# Patient Record
Sex: Female | Born: 1990 | Race: Black or African American | Hispanic: No | Marital: Single | Smoking: Never smoker
Health system: Southern US, Community
[De-identification: ages and names within clinical notes are randomized; demographics above are authoritative.]

---

## 2014-07-22 ENCOUNTER — Telehealth: Payer: Self-pay | Admitting: Hematology

## 2014-07-22 NOTE — Telephone Encounter (Signed)
S/W PATIENT AND GAVE NP APPT FOR 02/04 @ 10:30 W/DR. FENG REFERRING LAKASHA Ardath SaxGODWIN CARTER, FNP DX-BETA THALASSEMIA

## 2014-08-11 ENCOUNTER — Ambulatory Visit (HOSPITAL_BASED_OUTPATIENT_CLINIC_OR_DEPARTMENT_OTHER): Payer: BLUE CROSS/BLUE SHIELD

## 2014-08-11 ENCOUNTER — Ambulatory Visit: Payer: BLUE CROSS/BLUE SHIELD

## 2014-08-11 ENCOUNTER — Telehealth: Payer: Self-pay | Admitting: Hematology

## 2014-08-11 ENCOUNTER — Encounter: Payer: Self-pay | Admitting: Hematology

## 2014-08-11 ENCOUNTER — Encounter (INDEPENDENT_AMBULATORY_CARE_PROVIDER_SITE_OTHER): Payer: Self-pay

## 2014-08-11 ENCOUNTER — Ambulatory Visit (HOSPITAL_BASED_OUTPATIENT_CLINIC_OR_DEPARTMENT_OTHER): Payer: BLUE CROSS/BLUE SHIELD | Admitting: Hematology

## 2014-08-11 VITALS — BP 124/66 | HR 71 | Temp 98.5°F | Resp 22 | Ht 63.0 in | Wt 161.6 lb

## 2014-08-11 DIAGNOSIS — D649 Anemia, unspecified: Secondary | ICD-10-CM

## 2014-08-11 DIAGNOSIS — D6489 Other specified anemias: Secondary | ICD-10-CM

## 2014-08-11 LAB — COMPREHENSIVE METABOLIC PANEL (CC13)
ALBUMIN: 3.9 g/dL (ref 3.5–5.0)
ALK PHOS: 54 U/L (ref 40–150)
ALT: 12 U/L (ref 0–55)
AST: 16 U/L (ref 5–34)
Anion Gap: 8 mEq/L (ref 3–11)
BUN: 12.6 mg/dL (ref 7.0–26.0)
CHLORIDE: 111 meq/L — AB (ref 98–109)
CO2: 20 mEq/L — ABNORMAL LOW (ref 22–29)
Calcium: 8.9 mg/dL (ref 8.4–10.4)
Creatinine: 0.7 mg/dL (ref 0.6–1.1)
EGFR: >90 mL/min/{1.73_m2} (ref >90)
GLUCOSE: 88 mg/dL (ref 70–140)
Potassium: 3.9 mEq/L (ref 3.5–5.1)
SODIUM: 139 meq/L (ref 136–145)
TOTAL PROTEIN: 7.1 g/dL (ref 6.4–8.3)
Total Bilirubin: 0.45 mg/dL (ref 0.20–1.20)

## 2014-08-11 LAB — CBC & DIFF AND RETIC
BASO%: 0.3 % (ref 0.0–2.0)
Basophils Absolute: 0 10*3/uL (ref 0.0–0.1)
EOS ABS: 0.1 10*3/uL (ref 0.0–0.5)
EOS%: 0.9 % (ref 0.0–7.0)
HCT: 34 % — ABNORMAL LOW (ref 34.8–46.6)
HEMOGLOBIN: 11 g/dL — AB (ref 11.6–15.9)
Immature Retic Fract: 7.7 % (ref 1.60–10.00)
LYMPH%: 30.5 % (ref 14.0–49.7)
MCH: 22.3 pg — AB (ref 25.1–34.0)
MCHC: 32.4 g/dL (ref 31.5–36.0)
MCV: 69 fL — ABNORMAL LOW (ref 79.5–101.0)
MONO#: 0.4 10*3/uL (ref 0.1–0.9)
MONO%: 7.4 % (ref 0.0–14.0)
NEUT#: 3.5 10*3/uL (ref 1.5–6.5)
NEUT%: 60.9 % (ref 38.4–76.8)
NRBC: 0 % (ref 0–0)
Platelets: 247 10*3/uL (ref 145–400)
RBC: 4.93 10*6/uL (ref 3.70–5.45)
RDW: 16.1 % — ABNORMAL HIGH (ref 11.2–14.5)
RETIC %: 1.7 % (ref 0.70–2.10)
Retic Ct Abs: 83.81 10*3/uL (ref 33.70–90.70)
WBC: 5.8 10*3/uL (ref 3.9–10.3)
lymph#: 1.8 10*3/uL (ref 0.9–3.3)

## 2014-08-11 LAB — IRON AND TIBC CHCC
%SAT: 30 % (ref 21–57)
Iron: 88 ug/dL (ref 41–142)
TIBC: 291 ug/dL (ref 236–444)
UIBC: 204 ug/dL (ref 120–384)

## 2014-08-11 LAB — LACTATE DEHYDROGENASE (CC13): LDH: 124 U/L — ABNORMAL LOW (ref 125–245)

## 2014-08-11 LAB — MORPHOLOGY: PLT EST: ADEQUATE

## 2014-08-11 LAB — FERRITIN CHCC: Ferritin: 17 ng/ml (ref 9–269)

## 2014-08-11 NOTE — Progress Notes (Signed)
Checked in new pt with no financial concerns prior to seeing the dr.  Pt is here for a hematology concern so financial assistance may not be needed but she has Raquel's card for any billing questions or concerns. °

## 2014-08-11 NOTE — Progress Notes (Signed)
[facility]  Telephone:([phone] Fax:([phone]  Clinic New consult Note   Patient Care Team: No Pcp Per Patient as PCP - General (General Practice) 08/11/2014  CHIEF COMPLAINTS/PURPOSE OF CONSULTATION:  Anemia   HISTORY OF PRESENTING ILLNESS:  Teresa BattyBrianda Barnick 24 y.o. female is here because of anemia. She was referred by her GYN.  She was told to have iron deficient anemia when she was a teenager, but she does not recall her blood counts number. She did take some oral iron for short period time, but did not continue. She did not require any blood transfusion.  Her mentral period is heavy, it lasts 5-6 days, with heavy bleeding for 4 days, she changes pads every 5-6 hours.  She denied any bleeding episodes including hematochezia, melana, hemoptysis, hematuria or epitaxis. No mucosal bleeding or easy bruising.   She has been following with her GYN as her primary care physician. She was told her iron level was low, she may have thalassemia. None of her prior lab results are available during her office visit. She denies any family history of anemia.  She has mild fatigue, but able to do daily activities, able to dance fo 30 mins, but does need take a few breaks. She has palpitation when she runs all climbs stairs. She is a Archivistcollege student, in her IT sales professionalmaster degree program.   MEDICAL HISTORY:  None   SURGICAL HISTORY: Tonsilectomy   SOCIAL HISTORY: History   Social History  . Marital Status: Single    Spouse Name: N/A    Number of Children: N/A  . Years of Education: N/A   Occupational History  . Not on file.   Social History Main Topics  . Smoking status: Not on file  . Smokeless tobacco: Not on file  . Alcohol Use: Not on file  . Drug Use: Not on file  . Sexual Activity: Not on file   Other Topics Concern  . Not on file   Social History Narrative  . No narrative on file    FAMILY HISTORY: No family history on file.  ALLERGIES:  has No Known  Allergies.  MEDICATIONS:  Current Outpatient Prescriptions  Medication Sig Dispense Refill  . NUVARING 0.12-0.015 MG/24HR vaginal ring   1   No current facility-administered medications for this visit.    REVIEW OF SYSTEMS:   Constitutional: Denies fevers, chills or abnormal night sweats Eyes: Denies blurriness of vision, double vision or watery eyes Ears, nose, mouth, throat, and face: Denies mucositis or sore throat Respiratory: Denies cough, dyspnea or wheezes Cardiovascular: Denies palpitation, chest discomfort or lower extremity swelling Gastrointestinal:  Denies nausea, heartburn or change in bowel habits Skin: Denies abnormal skin rashes Lymphatics: Denies new lymphadenopathy or easy bruising Neurological:Denies numbness, tingling or new weaknesses Behavioral/Psych: Mood is stable, no new changes  All other systems were reviewed with the patient and are negative.  PHYSICAL EXAMINATION: ECOG PERFORMANCE STATUS: 0 - Asymptomatic  Filed Vitals:   08/11/14 1124  BP: 124/66  Pulse: 71  Temp: 98.5 F (36.9 C)  Resp: 22   Filed Weights   08/11/14 1124  Weight: 161 lb 9.6 oz (73.301 kg)    GENERAL:alert, no distress and comfortable SKIN: skin color, texture, turgor are normal, no rashes or significant lesions EYES: normal, conjunctiva are pink and non-injected, sclera clear OROPHARYNX:no exudate, no erythema and lips, buccal mucosa, and tongue normal  NECK: supple, thyroid normal size, non-tender, without nodularity LYMPH:  no palpable lymphadenopathy in the cervical, axillary  or inguinal LUNGS: clear to auscultation and percussion with normal breathing effort HEART: regular rate & rhythm and no murmurs and no lower extremity edema ABDOMEN:abdomen soft, non-tender and normal bowel sounds Musculoskeletal:no cyanosis of digits and no clubbing  PSYCH: alert & oriented x 3 with fluent speech NEURO: no focal motor/sensory deficits  LABORATORY DATA:  I have reviewed the  data as listed CBC Latest Ref Rng 08/11/2014  WBC 3.9 - 10.3 10e3/uL 5.8  Hemoglobin 11.6 - 15.9 g/dL 11.0(L)  Hematocrit 34.8 - 46.6 % 34.0(L)  Platelets 145 - 400 10e3/uL 247    CMP Latest Ref Rng 08/11/2014  Glucose 70 - 140 mg/dl 88  BUN 7.0 - 60.4 mg/dL 54.0  Creatinine 0.6 - 1.1 mg/dL 0.7  Sodium 981 - 191 mEq/L 139  Potassium 3.5 - 5.1 mEq/L 3.9  CO2 22 - 29 mEq/L 20(L)  Calcium 8.4 - 10.4 mg/dL 8.9  Total Protein 6.4 - 8.3 g/dL 7.1  Total Bilirubin 4.78 - 1.20 mg/dL 2.95  Alkaline Phos 40 - 150 U/L 54  AST 5 - 34 U/L 16  ALT 0 - 55 U/L 12   Ferritin  Status: Finalresult Visible to patient:  Not Released Nextappt: 09/12/2014 at 09:30 AM in Oncology Mosetta Putt, Terrace Arabia, MD) Dx:  Anemia, unspecified anemia type         Ref Range 12:17 PM    Ferritin 9 - 269 ng/ml 17      Iron and TIBC [facility]  Status: Finalresult Visible to patient:  Not Released Nextappt: 09/12/2014 at 09:30 AM in Oncology Mosetta Putt, Terrace Arabia, MD) Dx:  Anemia, unspecified anemia type         Ref Range 12:17 PM    Iron 41 - 142 ug/dL 88   TIBC 621 - 308 ug/dL 657   UIBC 846 - 962 ug/dL 952   %SAT 21 - 57 % 30        RADIOGRAPHIC STUDIES: I have personally reviewed the radiological images as listed and agreed with the findings in the report. No results found.  ASSESSMENT & PLAN:  24 year old African-American female, possible anemia since teenager, here for anemia.  1. Microcytic, hypo-productive anemia -Her CBC today showed hemoglobin 11.0, hematocrit 34%, with MCV 69, MCH 22.3, normal MCHC and slightly increased RDW. Her absolute reticular count is 88. Her white count and platelet count are normal. -The study showed normal ferritin, serum iron, TIBC and saturation. This is not consistent with iron deficient anemia. -I'll check her TSH level, hemoglobin electrophoresis, alpha and beta thalassemia gene mutation.  Follow-up: I'll see her back in 1 month's to discuss the  above laboratory results.  All questions were answered. The patient knows to call the clinic with any problems, questions or concerns. I spent 30 minutes counseling the patient face to face. The total time spent in the appointment was 40 minutes and more than 50% was on counseling.     Malachy Mood, MD 08/11/2014  6:19 PM

## 2014-08-11 NOTE — Telephone Encounter (Signed)
Pt confirmed labs/ov per 02/04 POF, gave pt AVS.... KJ °

## 2014-08-15 LAB — HEMOGLOBINOPATHY EVALUATION
HGB S QUANTITAION: 0 %
Hemoglobin Other: 0 %
Hgb A2 Quant: 5 % — ABNORMAL HIGH (ref 2.2–3.2)
Hgb A: 91 % — ABNORMAL LOW (ref 96.8–97.8)
Hgb F Quant: 4 % — ABNORMAL HIGH (ref 0.0–2.0)

## 2014-08-15 LAB — FOLATE RBC: RBC Folate: 650 ng/mL (ref >280)

## 2014-08-15 LAB — HAPTOGLOBIN: Haptoglobin: 113 mg/dL (ref 43–212)

## 2014-08-15 LAB — VITAMIN B12: Vitamin B-12: 666 pg/mL (ref 211–911)

## 2014-08-30 ENCOUNTER — Emergency Department (HOSPITAL_COMMUNITY): Payer: BLUE CROSS/BLUE SHIELD

## 2014-08-30 ENCOUNTER — Encounter (HOSPITAL_COMMUNITY): Payer: Self-pay | Admitting: Physical Medicine and Rehabilitation

## 2014-08-30 ENCOUNTER — Emergency Department (HOSPITAL_COMMUNITY)
Admission: EM | Admit: 2014-08-30 | Discharge: 2014-08-30 | Disposition: A | Discharge status: Home / Self Care | Payer: BLUE CROSS/BLUE SHIELD | Attending: Emergency Medicine | Admitting: Emergency Medicine

## 2014-08-30 DIAGNOSIS — N831 Corpus luteum cyst of ovary, unspecified side: Secondary | ICD-10-CM

## 2014-08-30 DIAGNOSIS — K5900 Constipation, unspecified: Secondary | ICD-10-CM | POA: Insufficient documentation

## 2014-08-30 DIAGNOSIS — Z3202 Encounter for pregnancy test, result negative: Secondary | ICD-10-CM | POA: Diagnosis not present

## 2014-08-30 DIAGNOSIS — R109 Unspecified abdominal pain: Secondary | ICD-10-CM

## 2014-08-30 DIAGNOSIS — R1031 Right lower quadrant pain: Secondary | ICD-10-CM | POA: Diagnosis present

## 2014-08-30 DIAGNOSIS — R1030 Lower abdominal pain, unspecified: Secondary | ICD-10-CM

## 2014-08-30 LAB — CBC WITH DIFFERENTIAL/PLATELET
BASOS ABS: 0 10*3/uL (ref 0.0–0.1)
Basophils Relative: 0 % (ref 0–1)
EOS ABS: 0.1 10*3/uL (ref 0.0–0.7)
EOS PCT: 1 % (ref 0–5)
HCT: 31.8 % — ABNORMAL LOW (ref 36.0–46.0)
Hemoglobin: 10.2 g/dL — ABNORMAL LOW (ref 12.0–15.0)
Lymphocytes Relative: 19 % (ref 12–46)
Lymphs Abs: 1.5 10*3/uL (ref 0.7–4.0)
MCH: 22 pg — AB (ref 26.0–34.0)
MCHC: 32.1 g/dL (ref 30.0–36.0)
MCV: 68.7 fL — ABNORMAL LOW (ref 78.0–100.0)
Monocytes Absolute: 0.8 10*3/uL (ref 0.1–1.0)
Monocytes Relative: 10 % (ref 3–12)
Neutro Abs: 5.7 10*3/uL (ref 1.7–7.7)
Neutrophils Relative %: 71 % (ref 43–77)
Platelets: 240 10*3/uL (ref 150–400)
RBC: 4.63 MIL/uL (ref 3.87–5.11)
RDW: 15.8 % — ABNORMAL HIGH (ref 11.5–15.5)
WBC: 8.1 10*3/uL (ref 4.0–10.5)

## 2014-08-30 LAB — URINALYSIS, ROUTINE W REFLEX MICROSCOPIC
BILIRUBIN URINE: NEGATIVE
Glucose, UA: NEGATIVE mg/dL
KETONES UR: 40 mg/dL — AB
Leukocytes, UA: NEGATIVE
Nitrite: NEGATIVE
PH: 6 (ref 5.0–8.0)
Protein, ur: NEGATIVE mg/dL
SPECIFIC GRAVITY, URINE: 1.025 (ref 1.005–1.030)
Urobilinogen, UA: 0.2 mg/dL (ref 0.0–1.0)

## 2014-08-30 LAB — WET PREP, GENITAL
CLUE CELLS WET PREP: NONE SEEN
Trich, Wet Prep: NONE SEEN
Yeast Wet Prep HPF POC: NONE SEEN

## 2014-08-30 LAB — COMPREHENSIVE METABOLIC PANEL
ALK PHOS: 52 U/L (ref 39–117)
ALT: 11 U/L (ref 0–35)
AST: 16 U/L (ref 0–37)
Albumin: 3.6 g/dL (ref 3.5–5.2)
Anion gap: 5 (ref 5–15)
BILIRUBIN TOTAL: 0.8 mg/dL (ref 0.3–1.2)
BUN: 7 mg/dL (ref 6–23)
CO2: 27 mmol/L (ref 19–32)
Calcium: 8.9 mg/dL (ref 8.4–10.5)
Chloride: 104 mmol/L (ref 96–112)
Creatinine, Ser: 0.63 mg/dL (ref 0.50–1.10)
GFR calc Af Amer: >90 mL/min (ref >90)
GLUCOSE: 92 mg/dL (ref 70–99)
POTASSIUM: 3.9 mmol/L (ref 3.5–5.1)
Sodium: 136 mmol/L (ref 135–145)
Total Protein: 7.1 g/dL (ref 6.0–8.3)

## 2014-08-30 LAB — URINE MICROSCOPIC-ADD ON

## 2014-08-30 LAB — LIPASE, BLOOD: LIPASE: 20 U/L (ref 11–59)

## 2014-08-30 LAB — POC URINE PREG, ED: PREG TEST UR: NEGATIVE

## 2014-08-30 MED ORDER — HYDROCODONE-ACETAMINOPHEN 5-325 MG PO TABS: 1.0000 | ORAL_TABLET | Freq: Four times a day (QID) | ORAL | Status: DC | PRN

## 2014-08-30 NOTE — ED Provider Notes (Signed)
CSN: [id number]     Arrival date & time 08/30/14  1144 History   First MD Initiated Contact with Patient 08/30/14 1404     Chief Complaint  Patient presents with  . Abdominal Pain     (Consider location/radiation/quality/duration/timing/severity/associated sxs/prior Treatment) Patient is a 24 y.o. female presenting with abdominal pain. The history is provided by the patient. No language interpreter was used.  Abdominal Pain Pain location:  Suprapubic, RLQ and LLQ Pain quality: cramping and sharp   Pain radiates to:  LLQ and suprapubic region Pain severity:  Moderate Onset quality:  Gradual Duration:  5 days Timing:  Intermittent Progression:  Unchanged Chronicity:  New Context: not awakening from sleep, not eating, not previous surgeries, not recent illness, not recent sexual activity, not retching, not sick contacts, not suspicious food intake and not trauma   Worsened by:  Nothing tried Ineffective treatments:  OTC medications (Tramadol, Percocet) Associated symptoms: constipation, nausea and vomiting   Associated symptoms: no chest pain, no chills, no diarrhea, no dysuria, no fever, no vaginal bleeding and no vaginal discharge   Nausea:    Severity:  Mild   Onset quality:  Sudden   Duration:  1 day   Timing:  Sporadic   Progression:  Improving Vomiting:    Quality:  Stomach contents   Number of occurrences:  1   Severity:  Mild   Duration:  1 day   Timing:  Sporadic   Progression:  Improving   History reviewed. No pertinent past medical history. History reviewed. No pertinent past surgical history. No family history on file. History  Substance Use Topics  . Smoking status: Never Smoker   . Smokeless tobacco: Not on file  . Alcohol Use: Yes     Comment: social drinker    OB History    No data available     Review of Systems  Constitutional: Negative for fever and chills.  Cardiovascular: Negative for chest pain.  Gastrointestinal: Positive for nausea,  vomiting, abdominal pain and constipation. Negative for diarrhea.  Genitourinary: Negative for dysuria, flank pain, vaginal bleeding, vaginal discharge and pelvic pain.  Musculoskeletal: Negative for myalgias.  Skin: Negative for rash.  Neurological: Negative for dizziness, weakness, light-headedness, numbness and headaches.  Hematological: Negative for adenopathy. Does not bruise/bleed easily.  All other systems reviewed and are negative.     Allergies  Review of patient's allergies indicates no known allergies.  Home Medications   Prior to Admission medications   Medication Sig Start Date End Date Taking? Authorizing Provider  ibuprofen (ADVIL,MOTRIN) 600 MG tablet Take 600 mg by mouth every 6 (six) hours as needed for mild pain.   Yes Historical Provider, MD  NUVARING 0.12-0.015 MG/24HR vaginal ring Place 1 each vaginally every 28 (twenty-eight) days.  07/28/14  Yes Historical Provider, MD  Oxycodone-Acetaminophen (PERCOCET PO) Take 2 tablets by mouth every 4 (four) hours.   Yes Historical Provider, MD  traMADol (ULTRAM) 50 MG tablet Take 50 mg by mouth every 6 (six) hours as needed for moderate pain.   Yes Historical Provider, MD   BP 119/69 mmHg  Pulse 93  Temp(Src) 98.3 F (36.8 C) (Oral)  Resp 18  SpO2 99% Physical Exam  Constitutional: She is oriented to person, place, and time. She appears well-developed and well-nourished.  HENT:  Head: Normocephalic and atraumatic.  Right Ear: External ear normal.  Left Ear: External ear normal.  Mouth/Throat: Oropharynx is clear and moist.  Eyes: Conjunctivae and EOM are normal. Pupils are equal,  round, and reactive to light.  Neck: Normal range of motion. Neck supple.  Cardiovascular: Normal rate, regular rhythm, normal heart sounds and intact distal pulses.   Pulmonary/Chest: Effort normal and breath sounds normal. No respiratory distress. She has no wheezes. She has no rales. She exhibits no tenderness.  Abdominal: Bowel sounds  are normal. She exhibits no mass. There is tenderness. There is no rebound and no guarding.  Some asymmetic lower abdominal fullness.  TTP RLQ, suprapubic, LLQ, pelvis.  Otherwise soft, no guarding, no rebound.   Musculoskeletal: Normal range of motion.  Neurological: She is alert and oriented to person, place, and time.  Skin: Skin is warm and dry.  Nursing note and vitals reviewed.   ED Course  Procedures (including critical care time) Labs Review Labs Reviewed  WET PREP, GENITAL - Abnormal; Notable for the following:    WBC, Wet Prep HPF POC MANY (*)    All other components within normal limits  CBC WITH DIFFERENTIAL/PLATELET - Abnormal; Notable for the following:    Hemoglobin 10.2 (*)    HCT 31.8 (*)    MCV 68.7 (*)    MCH 22.0 (*)    RDW 15.8 (*)    All other components within normal limits  URINALYSIS, ROUTINE W REFLEX MICROSCOPIC - Abnormal; Notable for the following:    APPearance HAZY (*)    Hgb urine dipstick SMALL (*)    Ketones, ur 40 (*)    All other components within normal limits  URINE MICROSCOPIC-ADD ON - Abnormal; Notable for the following:    Squamous Epithelial / LPF FEW (*)    All other components within normal limits  COMPREHENSIVE METABOLIC PANEL  LIPASE, BLOOD  POC URINE PREG, ED  GC/CHLAMYDIA PROBE AMP ([place])    Imaging Review No results found.   EKG Interpretation None      MDM   Final diagnoses:  Lower abdominal pain  Corpus luteum cyst   24 yo F hx of anemia, presents with CC abdominal/pelvic pain X 5 days.  Associated with recent LMP, pt on Nuvaring for birth control.  Describes pain as crampy, then sharp, worsening Sunday, RLQ with radiation suprapubic, LLQ, and into pelvis.  Pt had episode of nausea/vomiting X 1 this morning, and c/o of some constipation.  No prior abdominal surgeries.    Physical exam as above.  Afebrile, VS WNL.  CBC with microcytic anemia to 10.2 (down from 11.0 two weeks ago).  CMP WNL.    Pelvic exam  performed by Dr. Hyacinth MeekerMiller, reportedly unremarkable, see his note for details.  Pelvic US demonstrated R ovarian corpus luteal cyst.  Likely cause of pt's pain.  On reeval pt still feels well, declines pain meds.  Abdomen reexam remains soft, non concerning for acute abdomen.  Wet prep neg for trich, clue cells, or yeast.  WBC's present, pt just finishing LMP.    Diagnosis of R ovarian cyst.    D/c home in good condition.  Will have pt f/u with gynecology in 1 week, [facility]  Norco Rx for pain control.  Return precautions given.  Pt understands and agrees with plan.   Jon GillsWebb, Breniyah Romm  Discussed pt with Dr. Shirley FriarMiller   Leticia Mcdiarmid, MD 08/30/14 Serena Croissant1928  Vida RollerBrian D Miller, MD 08/30/14 2116

## 2014-08-30 NOTE — ED Provider Notes (Signed)
The patient is a 24 year old female, she presents with approximately one month of progressive fluctuating lower abdominal and pelvic pain both right side left side and suprapubic. On exam the patient is very soft, no peritoneal signs, no guarding, mild tenderness in the lower right and left pelvis, pelvic exam as chaperoned by nurse Clydie BraunKaren, no signs of significant vaginal discharge, small amount of brown discharge from the cervix, no foul smell, NuvaRing present, no tenderness over the cervix, no cervical motion tenderness, no adnexal tenderness or masses. Normal-appearing external genitalia. Labs unremarkable, patient will receive ultrasound of the pelvis to evaluate for other sources of pain. GC, chlamydia, wet prep sent.  I saw and evaluated the patient, reviewed the resident's note and I agree with the findings and plan.  I personally interpreted the EKG as well as the resident and agree with the interpretation on the resident's chart.  Final diagnoses:  Lower abdominal pain  Corpus luteum cyst      Vida RollerBrian D Laia Wiley, MD 08/30/14 2116

## 2014-08-30 NOTE — ED Notes (Signed)
Pt presents to department for evaluation of RLQ abdominal pain and nausea/vomiting. Onset Sunday night. Denies vaginal symptoms. 8/10 pain upon arrival to ED.

## 2014-08-31 LAB — GC/CHLAMYDIA PROBE AMP (~~LOC~~) NOT AT ARMC
Chlamydia: NEGATIVE
Neisseria Gonorrhea: NEGATIVE

## 2014-09-12 ENCOUNTER — Ambulatory Visit: Payer: BLUE CROSS/BLUE SHIELD | Admitting: Hematology

## 2014-09-26 ENCOUNTER — Ambulatory Visit (HOSPITAL_BASED_OUTPATIENT_CLINIC_OR_DEPARTMENT_OTHER): Payer: BLUE CROSS/BLUE SHIELD

## 2014-09-26 ENCOUNTER — Telehealth: Payer: Self-pay | Admitting: Hematology

## 2014-09-26 ENCOUNTER — Encounter: Payer: Self-pay | Admitting: Hematology

## 2014-09-26 ENCOUNTER — Ambulatory Visit (HOSPITAL_BASED_OUTPATIENT_CLINIC_OR_DEPARTMENT_OTHER): Payer: BLUE CROSS/BLUE SHIELD | Admitting: Hematology

## 2014-09-26 VITALS — BP 110/66 | HR 76 | Temp 98.4°F | Resp 19 | Ht 63.0 in | Wt 164.5 lb

## 2014-09-26 DIAGNOSIS — D563 Thalassemia minor: Secondary | ICD-10-CM

## 2014-09-26 DIAGNOSIS — D649 Anemia, unspecified: Secondary | ICD-10-CM

## 2014-09-26 LAB — CBC & DIFF AND RETIC
BASO%: 0.1 % (ref 0.0–2.0)
Basophils Absolute: 0 10*3/uL (ref 0.0–0.1)
EOS ABS: 0.1 10*3/uL (ref 0.0–0.5)
EOS%: 1.2 % (ref 0.0–7.0)
HEMATOCRIT: 34.7 % — AB (ref 34.8–46.6)
HGB: 11.1 g/dL — ABNORMAL LOW (ref 11.6–15.9)
Immature Retic Fract: 6.9 % (ref 1.60–10.00)
LYMPH#: 2.1 10*3/uL (ref 0.9–3.3)
LYMPH%: 30.7 % (ref 14.0–49.7)
MCH: 22.4 pg — ABNORMAL LOW (ref 25.1–34.0)
MCHC: 32 g/dL (ref 31.5–36.0)
MCV: 70 fL — ABNORMAL LOW (ref 79.5–101.0)
MONO#: 0.5 10*3/uL (ref 0.1–0.9)
MONO%: 7.3 % (ref 0.0–14.0)
NEUT#: 4.1 10*3/uL (ref 1.5–6.5)
NEUT%: 60.7 % (ref 38.4–76.8)
Platelets: 213 10*3/uL (ref 145–400)
RBC: 4.96 10*6/uL (ref 3.70–5.45)
RDW: 16.2 % — ABNORMAL HIGH (ref 11.2–14.5)
RETIC %: 1.42 % (ref 0.70–2.10)
Retic Ct Abs: 70.43 10*3/uL (ref 33.70–90.70)
WBC: 6.8 10*3/uL (ref 3.9–10.3)

## 2014-09-26 NOTE — Progress Notes (Signed)
[facility]  Telephone:(336) [phone] Fax:(336) [phone]  Clinic New consult Note   Patient Care Team: No Pcp Per Patient as PCP - General (General Practice) 09/26/2014  CHIEF COMPLAINTS/PURPOSE OF CONSULTATION:  Anemia   HISTORY OF PRESENTING ILLNESS:  Teresa Burgess 24 y.o. female is here because of anemia. She was referred by her GYN.  She was told to have iron deficient anemia when she was a teenager, but she does not recall her blood counts number. She did take some oral iron for short period time, but did not continue. She did not require any blood transfusion.  Her mentral period is heavy, it lasts 5-6 days, with heavy bleeding for 4 days, she changes pads every 5-6 hours.  She denied any bleeding episodes including hematochezia, melana, hemoptysis, hematuria or epitaxis. No mucosal bleeding or easy bruising.   She has been following with her GYN as her primary care physician. She was told her iron level was low, she may have thalassemia. None of her prior lab results are available during her office visit. She denies any family history of anemia.  She has mild fatigue, but able to do daily activities, able to dance fo 30 mins, but does need take a few breaks. She has palpitation when she runs all climbs stairs. She is a Archivistcollege student, in her IT sales professionalmaster degree program.   MEDICAL HISTORY:  None   SURGICAL HISTORY: Tonsilectomy   SOCIAL HISTORY: History   Social History  . Marital Status: Single    Spouse Name: N/A    Number of Children: N/A  . Years of Education: N/A   Occupational History  . Not on file.   Social History Main Topics  . Smoking status: Not on file  . Smokeless tobacco: Not on file  . Alcohol Use: Not on file  . Drug Use: Not on file  . Sexual Activity: Not on file   Other Topics Concern  . Not on file   Social History Narrative  . No narrative on file    FAMILY HISTORY: No family history on file.  ALLERGIES:  has No Known  Allergies.  MEDICATIONS:  Current Outpatient Prescriptions  Medication Sig Dispense Refill  . HYDROcodone-acetaminophen (NORCO/VICODIN) 5-325 MG per tablet Take 1 tablet by mouth every 6 (six) hours as needed. 10 tablet 0  . ibuprofen (ADVIL,MOTRIN) 600 MG tablet Take 600 mg by mouth every 6 (six) hours as needed for mild pain.    Marland Kitchen. NUVARING 0.12-0.015 MG/24HR vaginal ring Place 1 each vaginally every 28 (twenty-eight) days.   1  . Oxycodone-Acetaminophen (PERCOCET PO) Take 2 tablets by mouth every 4 (four) hours.    . traMADol (ULTRAM) 50 MG tablet Take 50 mg by mouth every 6 (six) hours as needed for moderate pain.     No current facility-administered medications for this visit.    REVIEW OF SYSTEMS:   Constitutional: Denies fevers, chills or abnormal night sweats Eyes: Denies blurriness of vision, double vision or watery eyes Ears, nose, mouth, throat, and face: Denies mucositis or sore throat Respiratory: Denies cough, dyspnea or wheezes Cardiovascular: Denies palpitation, chest discomfort or lower extremity swelling Gastrointestinal:  Denies nausea, heartburn or change in bowel habits Skin: Denies abnormal skin rashes Lymphatics: Denies new lymphadenopathy or easy bruising Neurological:Denies numbness, tingling or new weaknesses Behavioral/Psych: Mood is stable, no new changes  All other systems were reviewed with the patient and are negative.  PHYSICAL EXAMINATION: ECOG PERFORMANCE STATUS: 0 - Asymptomatic  There were no  vitals filed for this visit. There were no vitals filed for this visit.  GENERAL:alert, no distress and comfortable SKIN: skin color, texture, turgor are normal, no rashes or significant lesions EYES: normal, conjunctiva are pink and non-injected, sclera clear OROPHARYNX:no exudate, no erythema and lips, buccal mucosa, and tongue normal  NECK: supple, thyroid normal size, non-tender, without nodularity LYMPH:  no palpable lymphadenopathy in the cervical,  axillary or inguinal LUNGS: clear to auscultation and percussion with normal breathing effort HEART: regular rate & rhythm and no murmurs and no lower extremity edema ABDOMEN:abdomen soft, non-tender and normal bowel sounds Musculoskeletal:no cyanosis of digits and no clubbing  PSYCH: alert & oriented x 3 with fluent speech NEURO: no focal motor/sensory deficits  LABORATORY DATA:  I have reviewed the data as listed CBC Latest Ref Rng 08/30/2014 08/11/2014  WBC 4.0 - 10.5 K/uL 8.1 5.8  Hemoglobin 12.0 - 15.0 g/dL 10.2(L) 11.0(L)  Hematocrit 36.0 - 46.0 % 31.8(L) 34.0(L)  Platelets 150 - 400 K/uL 240 247    CMP Latest Ref Rng 08/30/2014 08/11/2014  Glucose 70 - 99 mg/dL 92 88  BUN 6 - 23 mg/dL 7 96.0  Creatinine 4.54 - 1.10 mg/dL 0.98 0.7  Sodium 119 - 145 mmol/L 136 139  Potassium 3.5 - 5.1 mmol/L 3.9 3.9  Chloride 96 - 112 mmol/L 104 -  CO2 19 - 32 mmol/L 27 20(L)  Calcium 8.4 - 10.5 mg/dL 8.9 8.9  Total Protein 6.0 - 8.3 g/dL 7.1 7.1  Total Bilirubin 0.3 - 1.2 mg/dL 0.8 1.47  Alkaline Phos 39 - 117 U/L 52 54  AST 0 - 37 U/L 16 16  ALT 0 - 35 U/L 11 12   Ferritin  Status: Finalresult Visible to patient:  Not Released Nextappt: 09/12/2014 at 09:30 AM in Oncology Mosetta Putt, Terrace Arabia, MD) Dx:  Anemia, unspecified anemia type         Ref Range 12:17 PM    Ferritin 9 - 269 ng/ml 17      Iron and TIBC [facility]  Status: Finalresult Visible to patient:  Not Released Nextappt: 09/12/2014 at 09:30 AM in Oncology Mosetta Putt, Terrace Arabia, MD) Dx:  Anemia, unspecified anemia type         Ref Range 12:17 PM    Iron 41 - 142 ug/dL 88   TIBC 829 - 562 ug/dL 130   UIBC 865 - 784 ug/dL 696   %SAT 21 - 57 % 30      Morphology  Status: Finalresult Visible to patient:  Not Released Nextappt: None Dx:  Anemia, unspecified anemia type         Ref Range 14mo ago    Polychromasia Slight  Slight   Tear Drop Cells Negative  Few   Ovalocytes  Negative  Few   Helmet Cell Negative  Few   White Cell Comments  C/W auto diff   PLT EST Adequate  Adequate        Hemoglobinopathy evaluation  Status: Finalresult Visible to patient:  Not Released Nextappt: None Dx:  Anemia, unspecified anemia type            Ref Range 14mo ago    Hgb A 96.8 - 97.8 % 91.0 (L)   Hgb A2 Quant 2.2 - 3.2 % 5.0 (H)   Hgb F Quant 0.0 - 2.0 % 4.0 (H)   Comments: Patient State         HbA2 Level     HbF Level-----------------------------------------------------------Heterozygous B-thalassemia    4-9%  1-5%Homozygous B-thalassemia  Normal or increased  80-100%Heterozygous HPFH        Less than 1.5%    10-20%Homozygous HPFH         Absent       100%     Hgb S Quant 0.0 % 0.0   Hemoglobin Other 0.0 % 0.0          RADIOGRAPHIC STUDIES: I have personally reviewed the radiological images as listed and agreed with the findings in the report.  ASSESSMENT & PLAN:  24 year old African-American female, possible anemia since teenager, here for anemia.  1. Beta thalassemia trait -Her initial CBC showed hemoglobin 11.0, hematocrit 34%, with MCV 69, MCH 22.3, normal MCHC and slightly increased RDW. Her absolute reticular count is 88. Her white count and platelet count are normal. -The study showed normal ferritin, serum iron, TIBC and saturation. This is not consistent with iron deficient anemia. -her hemoglobin electrophoresis showed increased hemoglobin A2 and hemoglobin F level, this is most consistent with beta thalassemia trait, giving her mild anemia. I'll confirm with beta thalassemia gene mutation test. -I recommend her take folic acid once daily. Avoid iron supplement. -We'll check her abdominal ultrasound to evaluate her liver and spleen -Since she has a mild anemia and never required a blood transfusion, her anemia can be followed clinically. -We discussed genetic  screening if she is planning to have children in the future. Her father did not have anemia and she does not know much about her biological mother.  Follow-up: I'll see her back in 4 months for follow-up.  All questions were answered. The patient knows to call the clinic with any problems, questions or concerns. I spent 20 minutes counseling the patient face to face. The total time spent in the appointment was 25 minutes and more than 50% was on counseling.     Malachy Mood, MD 09/26/2014  9:02 AM

## 2014-09-26 NOTE — Telephone Encounter (Signed)
gv and printed appt sched and av sfo rpt for Sept....sent pt to lab °

## 2015-03-27 ENCOUNTER — Encounter: Payer: BLUE CROSS/BLUE SHIELD | Admitting: Hematology

## 2015-03-27 ENCOUNTER — Encounter: Payer: Self-pay | Admitting: Hematology

## 2015-03-27 NOTE — Progress Notes (Signed)
This encounter was created in error - please disregard.

## 2015-03-28 ENCOUNTER — Telehealth: Payer: Self-pay | Admitting: Hematology

## 2015-03-28 NOTE — Telephone Encounter (Signed)
per pof to r/s pt appt-cld pt and left message to call & r/s appt that was missed on 9/19

## 2015-08-16 IMAGING — US US PELVIS COMPLETE
1 series · 13 of 25 positions shown · non-contrast
Comparison: None

CLINICAL DATA: Abdominal pain.  Pelvic pain for 3 days.

EXAM:
TRANSABDOMINAL AND TRANSVAGINAL ULTRASOUND OF PELVIS
TECHNIQUE: Both transabdominal and transvaginal ultrasound examinations of the
pelvis were performed. Transabdominal technique was performed for
global imaging of the pelvis including uterus, ovaries, adnexal
regions, and pelvic cul-de-sac. It was necessary to proceed with
endovaginal exam following the transabdominal exam to visualize the
uterus, ovaries, and adnexa.

[Series 1: us pelvis complete · 0.20mm/px · 13 of 74 slices shown]
[im 1/74]
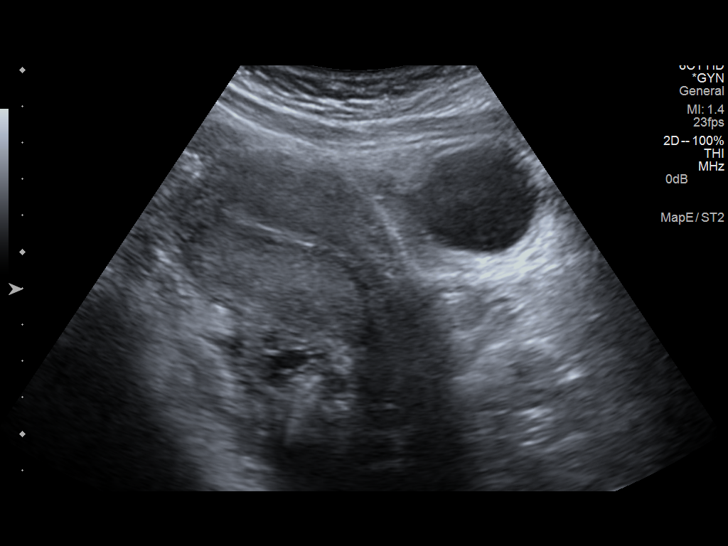
[im 7/74]
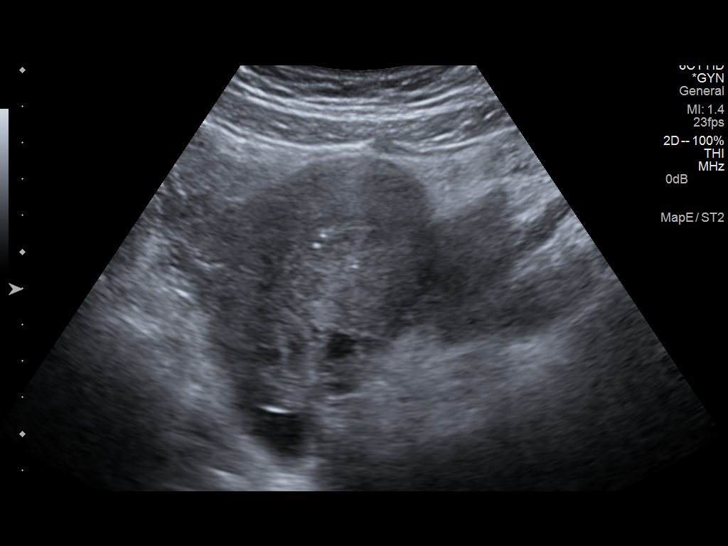
[im 13/74]
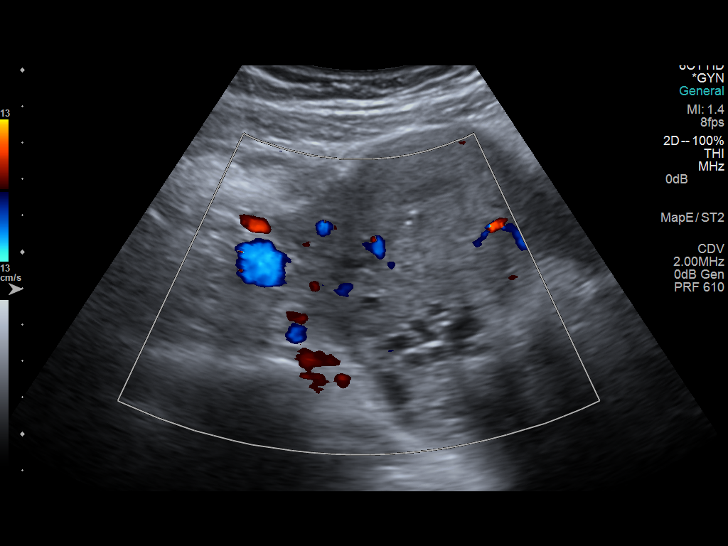
[im 19/74]
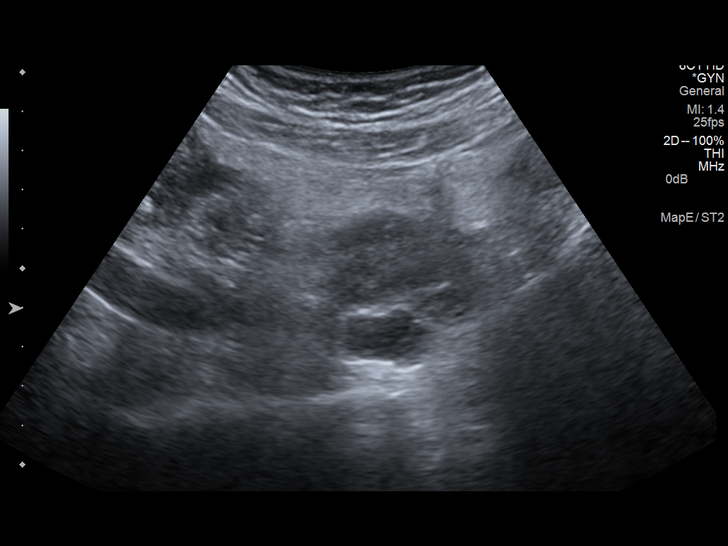
[im 25/74]
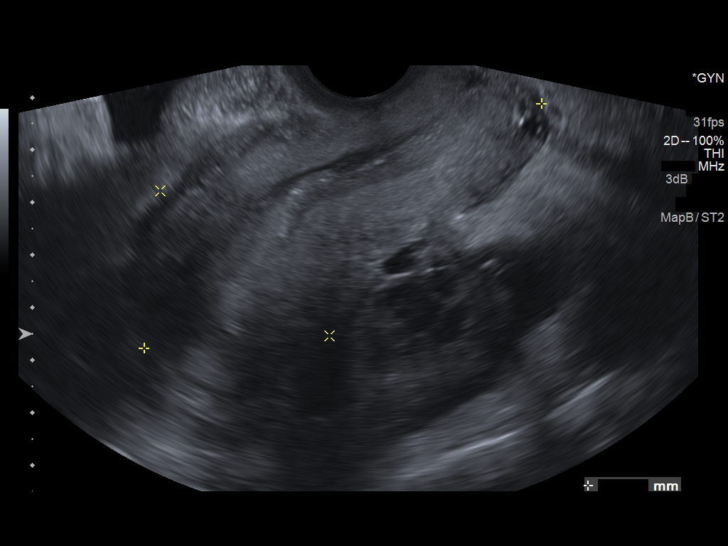
[im 31/74]
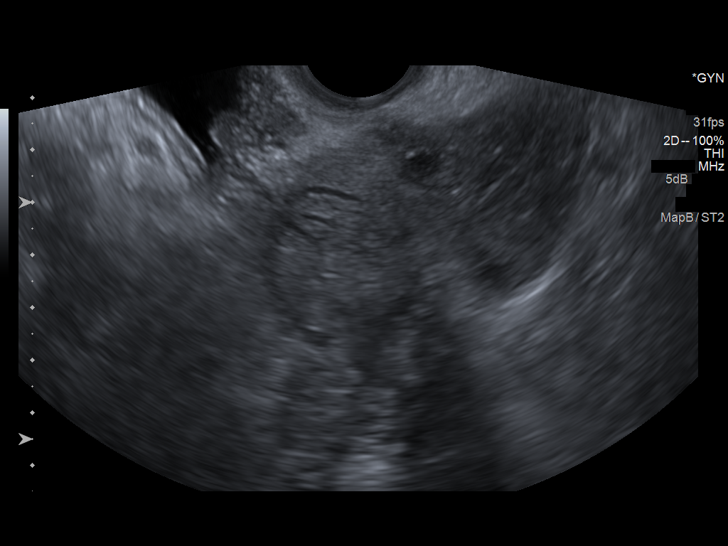
[im 37/74]
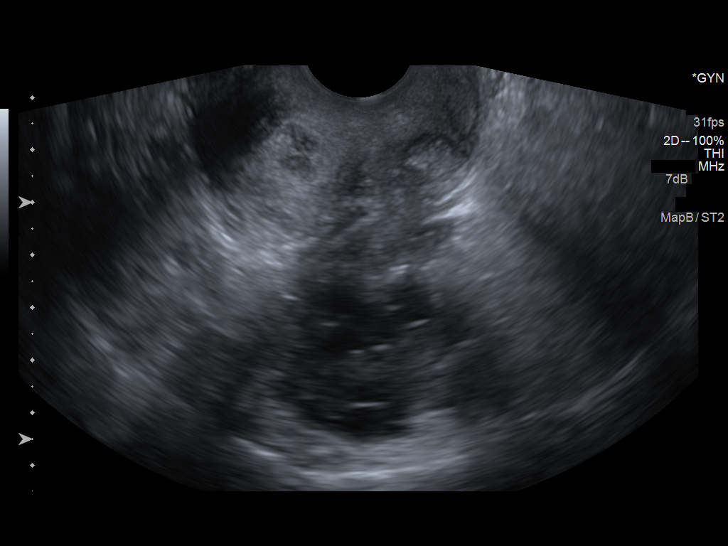
[im 43/74]
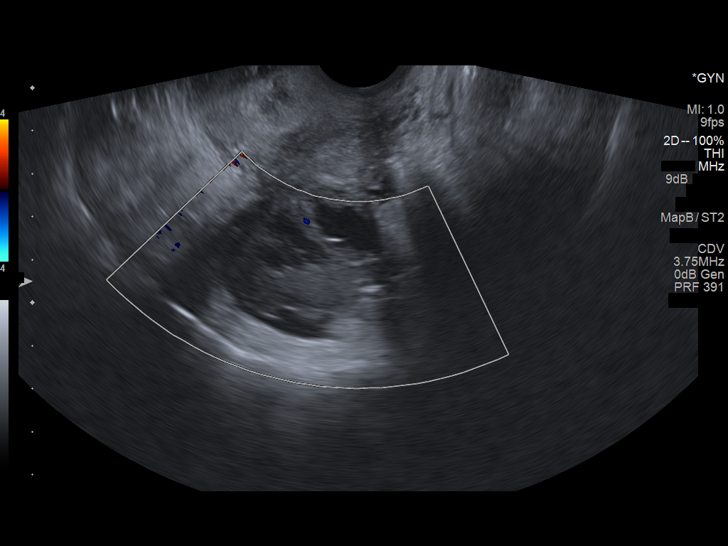
[im 49/74]
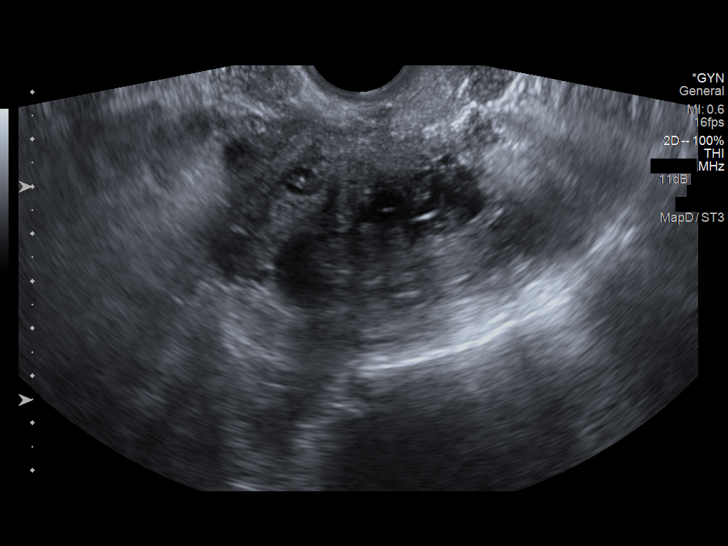
[im 55/74]
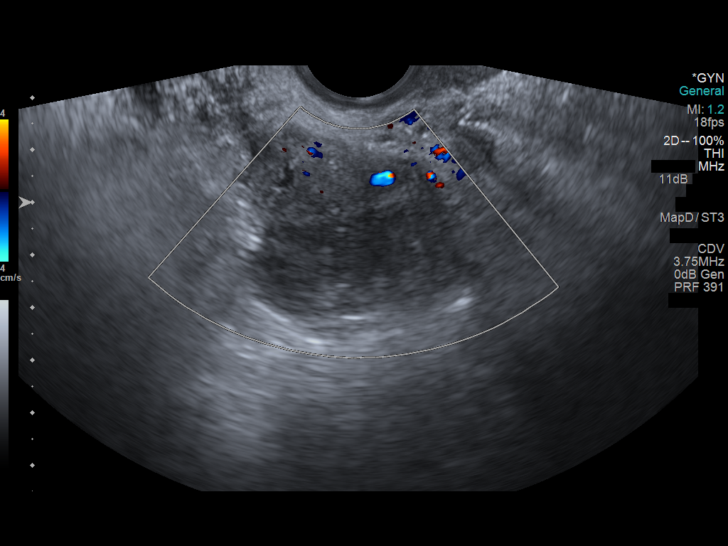
[im 61/74]
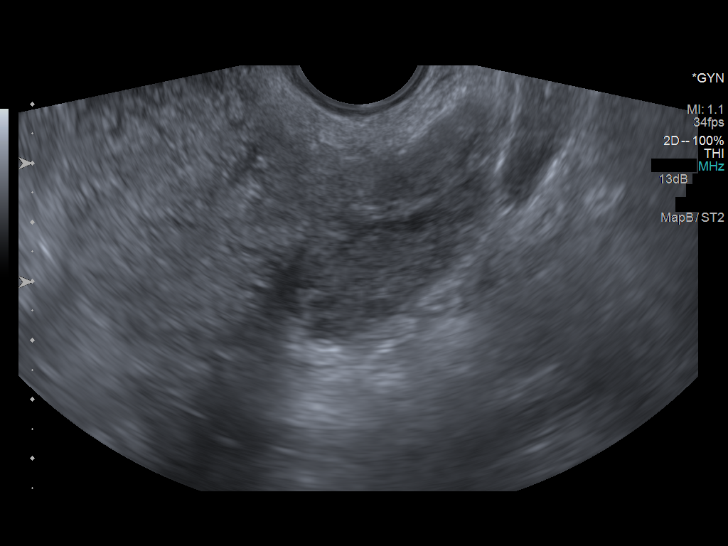
[im 67/74]
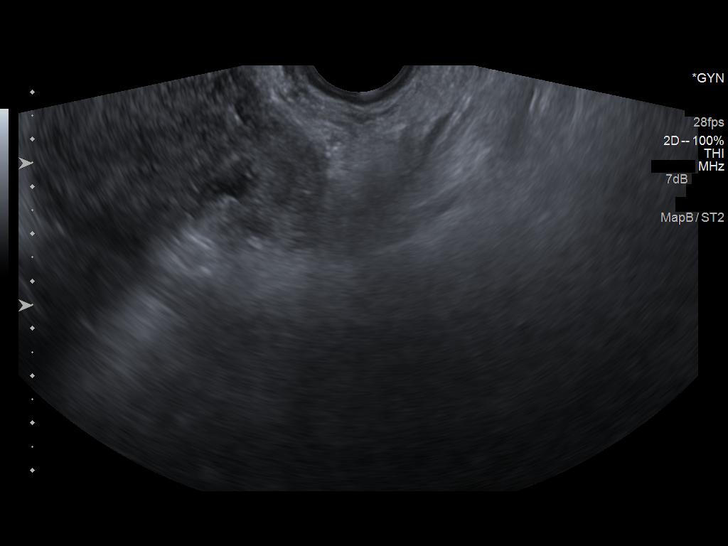
[im 74/74]
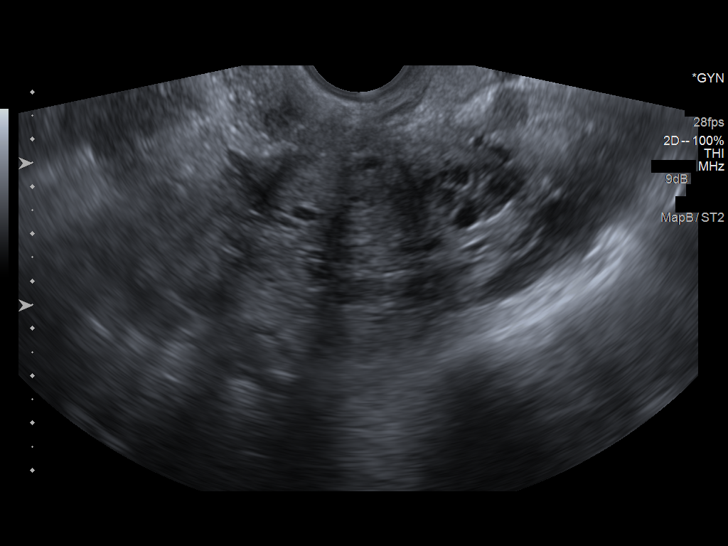

[13 of 25 positions shown; findings below may reference images not displayed]

FINDINGS: Uterus

Measurements: 8.9 x 4.2 x 5.4 cm. Normal in morphology.

Endometrium

Thickness: Upper normal for age, 1.5 cm. No focal abnormality
identified.

Right ovary

Measurements: 6.4 x 3.3 x 3.0 cm.. Suspect a corpus luteal cyst
within. On the order of 2.4 cm on image 49.

Left ovary

Measurements: 4.5 x 4.5 x 3.8 cm.. Normal in morphology.

Other findings

No free fluid.
IMPRESSION: 1. Probable right ovarian corpus luteal cyst. No other explanation
for patient's symptoms.
2. Endometrium upper normal for age. Correlate with symptoms of
excessive menstrual bleeding bleeding. If any such symptoms,
consider pelvic ultrasound followup in the week following the
patient's menses.
